# Patient Record
Sex: Female | Born: 2005 | Race: White | Hispanic: Yes | Marital: Single | Smoking: Never smoker
Health system: Southern US, Community
[De-identification: ages and names within clinical notes are randomized; demographics above are authoritative.]

## PROBLEM LIST (undated history)

## (undated) DIAGNOSIS — J020 Streptococcal pharyngitis: Secondary | ICD-10-CM

## (undated) DIAGNOSIS — B2790 Infectious mononucleosis, unspecified without complication: Secondary | ICD-10-CM

## (undated) DIAGNOSIS — N390 Urinary tract infection, site not specified: Secondary | ICD-10-CM

## (undated) DIAGNOSIS — R509 Fever, unspecified: Secondary | ICD-10-CM

## (undated) DIAGNOSIS — N12 Tubulo-interstitial nephritis, not specified as acute or chronic: Principal | ICD-10-CM

## (undated) HISTORY — DX: Fever, unspecified: R50.9

## (undated) HISTORY — DX: Streptococcal pharyngitis: J02.0

## (undated) HISTORY — DX: Tubulo-interstitial nephritis, not specified as acute or chronic: N12

## (undated) HISTORY — DX: Infectious mononucleosis, unspecified without complication: B27.90

---
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
## 2023-02-19 NOTE — Patient Instructions (Incomplete)
I have translated the following text using Google translate.  As such, there are many errors.  I apologize for the poor written translation; however, we do not have written  translation services yet. It was wonderful to see you today. Thank you for allowing me to be a part of your care. Below is a short summary of what we discussed at your visit today:  Annual physical Today we obtained a blood sample. If the results are normal, I will send you a letter or MyChart message. If the results are abnormal, I will give you a call.    Please set up your MyChart app so you can have quick, convenient access to your medical records and test results.   TB test We collected a test called a quantiferon GOLD blood test, which screens for TB. 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 If the results are normal, I will send you a letter or MyChart message. If the results are abnormal, I will give you a call.    Health Maintenance We like to think about ways to keep you healthy for years to come. Below are some interventions and screenings we can offer to keep you healthy: - Flu vaccine - COVID booster - TDaP vaccine   Please bring all of your medications to every appointment!  If you have any questions or concerns, please do not hesitate to contact us via phone or MyChart message.   Ezequiel Essex, MD

## 2023-02-20 ENCOUNTER — Ambulatory Visit (INDEPENDENT_AMBULATORY_CARE_PROVIDER_SITE_OTHER): Payer: Medicaid Other | Admitting: Family Medicine

## 2023-02-20 VITALS — BP 100/60 | HR 101 | Ht 63.5 in | Wt 92.2 lb

## 2023-02-20 DIAGNOSIS — Z111 Encounter for screening for respiratory tuberculosis: Secondary | ICD-10-CM

## 2023-02-20 DIAGNOSIS — Z0000 Encounter for general adult medical examination without abnormal findings: Secondary | ICD-10-CM | POA: Diagnosis not present

## 2023-02-20 DIAGNOSIS — R946 Abnormal results of thyroid function studies: Secondary | ICD-10-CM | POA: Insufficient documentation

## 2023-02-20 DIAGNOSIS — Z131 Encounter for screening for diabetes mellitus: Secondary | ICD-10-CM

## 2023-02-20 DIAGNOSIS — E049 Nontoxic goiter, unspecified: Secondary | ICD-10-CM | POA: Diagnosis not present

## 2023-02-20 DIAGNOSIS — Z114 Encounter for screening for human immunodeficiency virus [HIV]: Secondary | ICD-10-CM | POA: Diagnosis not present

## 2023-02-20 DIAGNOSIS — Z1159 Encounter for screening for other viral diseases: Secondary | ICD-10-CM

## 2023-02-20 NOTE — Progress Notes (Addendum)
   Adolescent Well Care Visit Jillian Merritt is a 17 y.o. female who is here for well care.     PCP:  Ezequiel Essex, MD   History was provided by the patient and mother.  Current Issues: Current concerns include wanting to gain weight.   PHQ-9 completed and results indicated: [place] Office Visit from 02/08/2022 in [place]  PHQ-9 Total Score 0       Safe at home, in school & in relationships?  Yes Safe to self?  Yes   Nutrition: Nutrition/Eating Behaviors: Trying to eat more portions of the food cooked by mom at home - Well-rounded diet - Fruits, vegetables, protein Restrictive eating patterns/purging: None  Social Screening: Lives with: Mom, brother, dad Parental relations:  good Concerns regarding behavior with peers?  no Stressors of note: no  Education: School Concerns: None School performance:outstanding School Behavior: doing well; no concerns  Patient has a dental home: yes  Menstruation:   Patient's last menstrual period was 02/15/2023.  Physical Exam:  BP 100/60   Pulse (!) 101   Ht 5' 3.5" (1.613 m)   Wt 92 lb 3.2 oz (41.8 kg)   LMP 02/15/2023   SpO2 99%   BMI 16.08 kg/m  Body mass index: body mass index is 16.08 kg/m. Blood pressure %iles are not available for patients who are 18 years or older.  Gen: awake, alert, NAD Neck: Supple, thyroid fuller to palpation on the left Cardiac: Regular rhythm.  Mildly tachycardic to low 100s.  Normal S1/S2. No murmurs, rubs, or gallops appreciated. Lungs: Clear bilaterally to ascultation.  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   Ezequiel Essex, MD

## 2023-02-20 NOTE — Assessment & Plan Note (Addendum)
-   Feels little more full on the left than the right to physician physical exam - Patient has no complaints about it and has not noticed this - Given inability to gain weight and mild asymptomatic tachycardia, will obtain TSH with reflex to free T4

## 2023-02-21 LAB — CBC
Hematocrit: 42.7 % (ref 34.0–46.6)
Hemoglobin: 13.6 g/dL (ref 11.1–15.9)
MCV: 93 fL (ref 79–97)
Platelets: 326 10*3/uL (ref 150–450)
RBC: 4.57 x10E6/uL (ref 3.77–5.28)

## 2023-02-21 LAB — BASIC METABOLIC PANEL
CO2: 21 mmol/L (ref 20–29)
Creatinine, Ser: 0.69 mg/dL (ref 0.57–1.00)
Glucose: 91 mg/dL (ref 70–99)

## 2023-02-21 LAB — QUANTIFERON-TB GOLD PLUS

## 2023-02-22 ENCOUNTER — Ambulatory Visit: Payer: Self-pay

## 2023-02-22 LAB — CBC
MCH: 29.8 pg (ref 26.6–33.0)
MCHC: 31.9 g/dL (ref 31.5–35.7)
RDW: 13.1 % (ref 11.7–15.4)
WBC: 5.8 10*3/uL (ref 3.4–10.8)

## 2023-02-22 LAB — HIV ANTIBODY (ROUTINE TESTING W REFLEX): HIV Screen 4th Generation wRfx: NONREACTIVE

## 2023-02-22 LAB — BASIC METABOLIC PANEL
BUN: 17 mg/dL (ref 6–20)
Chloride: 106 mmol/L (ref 96–106)
Sodium: 143 mmol/L (ref 134–144)

## 2023-03-01 ENCOUNTER — Encounter: Payer: Self-pay | Admitting: Family Medicine

## 2023-03-01 LAB — HEMOGLOBIN A1C
Est. average glucose Bld gHb Est-mCnc: 103 mg/dL
Hgb A1c MFr Bld: 5.2 % (ref 4.8–5.6)

## 2023-03-01 LAB — QUANTIFERON-TB GOLD PLUS
QuantiFERON Mitogen Value: 8.71 IU/mL
QuantiFERON Nil Value: 0.01 IU/mL
QuantiFERON TB1 Ag Value: 0.05 IU/mL
QuantiFERON TB2 Ag Value: 0.1 IU/mL
QuantiFERON-TB Gold Plus: NEGATIVE

## 2023-03-01 LAB — TSH RFX ON ABNORMAL TO FREE T4: TSH: 2.08 u[IU]/mL (ref 0.450–4.500)

## 2023-03-01 LAB — BASIC METABOLIC PANEL
BUN/Creatinine Ratio: 25 — ABNORMAL HIGH (ref 9–23)
Calcium: 9.7 mg/dL (ref 8.7–10.2)
Potassium: 4.3 mmol/L (ref 3.5–5.2)
eGFR: 129 mL/min/{1.73_m2} (ref >59)

## 2023-03-01 LAB — HEPATITIS C ANTIBODY: Hep C Virus Ab: NONREACTIVE

## 2023-09-20 ENCOUNTER — Ambulatory Visit (INDEPENDENT_AMBULATORY_CARE_PROVIDER_SITE_OTHER): Payer: Worker's Compensation

## 2023-09-20 ENCOUNTER — Ambulatory Visit (HOSPITAL_COMMUNITY)
Admission: EM | Admit: 2023-09-20 | Discharge: 2023-09-20 | Disposition: A | Discharge status: Home / Self Care | Payer: Worker's Compensation | Attending: Internal Medicine | Admitting: Internal Medicine

## 2023-09-20 ENCOUNTER — Encounter (HOSPITAL_COMMUNITY): Payer: Self-pay | Admitting: *Deleted

## 2023-09-20 ENCOUNTER — Other Ambulatory Visit: Payer: Self-pay

## 2023-09-20 DIAGNOSIS — S93602A Unspecified sprain of left foot, initial encounter: Secondary | ICD-10-CM

## 2023-09-20 DIAGNOSIS — S99922A Unspecified injury of left foot, initial encounter: Secondary | ICD-10-CM

## 2023-09-20 MED ORDER — IBUPROFEN 800 MG PO TABS
ORAL_TABLET | ORAL | Status: AC
  Filled 2023-09-20: qty 1

## 2023-09-20 MED ORDER — IBUPROFEN 800 MG PO TABS
800.0000 mg | ORAL_TABLET | Freq: Once | ORAL | Status: AC
  Administered 2023-09-20: 800 mg via ORAL

## 2023-09-20 MED ORDER — IBUPROFEN 800 MG PO TABS: 800.0000 mg | ORAL_TABLET | Freq: Three times a day (TID) | ORAL | 0 refills | Status: AC

## 2023-09-20 NOTE — ED Triage Notes (Signed)
Pt reports yesterday she was injured at work by a Horticulturist, commercial . Pt has pain to whole foot and back of ankle.

## 2023-09-20 NOTE — ED Provider Notes (Signed)
[facility]    CSN: [id number] Arrival date & time: 09/20/23  1802     History   Chief Complaint Chief Complaint  Patient presents with   Foot Injury    HPI Jillian Merritt is a 17 y.o. female.  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 May have twisted under her. Having 10/10 pain midfoot  No medications taken yet. She went to work today but had pain with weightbearing   No prior injury to foot  Past Medical History:  Diagnosis Date   Fever 03/27/2012   Mononucleosis 12/04/2012   Pyelonephritis 03/27/2012   Strep pharyngitis 03/27/2012   Strep throat    Urinary tract infection     Patient Active Problem List   Diagnosis Date Noted   Abnormal thyroid exam 02/20/2023   Poor weight gain (0-17) 02/08/2022   Acne vulgaris 01/06/2020    History reviewed. No pertinent surgical history.  OB History   No obstetric history on file.      Home Medications    Prior to Admission medications   Medication Sig Start Date End Date Taking? Authorizing Provider  ibuprofen (ADVIL) 800 MG tablet Take 1 tablet (800 mg total) by mouth 3 (three) times daily. 09/20/23  Yes Gabriana Wilmott, Lurena Joiner, PA-C    Family History Family History  Problem Relation Age of Onset   Diabetes Paternal Grandmother    Healthy Father     Social History Social History   Tobacco Use   Smoking status: Never   Smokeless tobacco: Never  Substance Use Topics   Alcohol use: No   Drug use: No     Allergies   Patient has no known allergies.   Review of Systems Review of Systems Per HPI  Physical Exam Triage Vital Signs ED Triage Vitals  Encounter Vitals Group     BP 09/20/23 1830 98/66     Systolic BP Percentile --      Diastolic BP Percentile --      Pulse Rate 09/20/23 1830 82     Resp 09/20/23 1830 16     Temp 09/20/23 1830 98.2 F  (36.8 C)     Temp src --      SpO2 09/20/23 1830 99 %     Weight --      Height --      Head Circumference --      Peak Flow --      Pain Score 09/20/23 1826 10     Pain Loc --      Pain Education --      Exclude from Growth Chart --    No data found.  Updated Vital Signs BP 98/66   Pulse 82   Temp 98.2 F (36.8 C)   Resp 16   LMP 09/08/2023   SpO2 99%    Physical Exam Vitals and nursing note reviewed.  Constitutional:      General: She is not in acute distress. Cardiovascular:     Rate and Rhythm: Normal rate and regular rhythm.     Pulses: Normal pulses.  Pulmonary:     Effort: Pulmonary effort is normal.  Musculoskeletal:     Cervical back: Normal range of motion.     Left foot: Decreased range of motion. Normal capillary refill. Swelling, tenderness and bony tenderness present. Normal pulse.     Comments:  Swelling of dorsal foot. Tender midfoot. No bony tenderness of toes or ankles. Can wiggle toes. Decreased ROM at ankle due to pain. Distal sensation intact. Strong DP and PT pulses. Cap refill < 2 seconds  Skin:    General: Skin is warm and dry.     Capillary Refill: Capillary refill takes less than 2 seconds.  Neurological:     Mental Status: She is alert and oriented to person, place, and time.     UC Treatments / Results  Labs (all labs ordered are listed, but only abnormal results are displayed) Labs Reviewed - No data to display  EKG  Radiology DG Foot Complete Left  Result Date: 09/20/2023 CLINICAL DATA:  Work injury whole foot pain EXAM: LEFT FOOT - COMPLETE 3+ VIEW COMPARISON:  None Available. FINDINGS: There is no evidence of fracture or dislocation. There is no evidence of arthropathy or other focal bone abnormality. Soft tissues are unremarkable. IMPRESSION: Negative. Electronically Signed   By: Jasmine Pang M.D.   On: 09/20/2023 19:31    Procedures Procedures (including critical care time)  Medications Ordered in UC Medications   ibuprofen (ADVIL) tablet 800 mg (800 mg Oral Given 09/20/23 1846)    Initial Impression / Assessment and Plan / UC Course  I have reviewed the triage vital signs and the nursing notes.  Pertinent labs & imaging results that were available during my care of the patient were reviewed by me and considered in my medical decision making (see chart for details).  Ibuprofen dose given for pain X-ray left foot negative. Images independently reviewed by me, agree with radiology interpretation. Discussed RICE therapy, ibuprofen, follow up with podiatry. Provided ace wrap for support.  Provided with employee health and wellness clinic for restrictions if needed.  Patient agreeable to plan, no questions at this time  Patient able to bear weight and having less discomfort at discharge   Final Clinical Impressions(s) / UC Diagnoses   Final diagnoses:  Injury of left foot, initial encounter  Sprain of left foot, initial encounter     Discharge Instructions      There is no broken bone in your foot. You may have sprained. it  Rest - try to avoid heavy lifting and high impact activity Ice - apply for 20 minutes a few times daily Compression - use ace wrap for support  Elevation - prop up on a pillow  Ibuprofen can be used every 6 hours for swelling and pain  Please follow up with the foot specialist  If you need work restrictions, please go to the employee health and wellness clinic     ED Prescriptions     Medication Sig Dispense Auth. Provider   ibuprofen (ADVIL) 800 MG tablet Take 1 tablet (800 mg total) by mouth 3 (three) times daily. 21 tablet Mack Thurmon, Lurena Joiner, PA-C      PDMP not reviewed this encounter.   Marlow Baars, [place] 09/20/23 1610

## 2023-09-20 NOTE — Discharge Instructions (Addendum)
There is no broken bone in your foot. You may have sprained. it  Rest - try to avoid heavy lifting and high impact activity Ice - apply for 20 minutes a few times daily Compression - use ace wrap for support  Elevation - prop up on a pillow  Ibuprofen can be used every 6 hours for swelling and pain  Please follow up with the foot specialist  If you need work restrictions, please go to the employee health and wellness clinic

## 2023-09-20 NOTE — ED Notes (Signed)
ACE wrap placed by provider

## 2023-09-30 ENCOUNTER — Ambulatory Visit (INDEPENDENT_AMBULATORY_CARE_PROVIDER_SITE_OTHER): Payer: Medicaid Other | Admitting: Podiatry

## 2023-09-30 ENCOUNTER — Encounter: Payer: Self-pay | Admitting: Podiatry

## 2023-09-30 ENCOUNTER — Ambulatory Visit (INDEPENDENT_AMBULATORY_CARE_PROVIDER_SITE_OTHER): Payer: Medicaid Other

## 2023-09-30 VITALS — Ht 63.5 in | Wt 100.0 lb

## 2023-09-30 DIAGNOSIS — M778 Other enthesopathies, not elsewhere classified: Secondary | ICD-10-CM

## 2023-09-30 DIAGNOSIS — S99922A Unspecified injury of left foot, initial encounter: Secondary | ICD-10-CM | POA: Diagnosis not present

## 2023-09-30 DIAGNOSIS — M7752 Other enthesopathy of left foot: Secondary | ICD-10-CM

## 2023-09-30 NOTE — Progress Notes (Signed)
   Chief Complaint  Patient presents with   Foot Pain    Left foot pain due to injury hurt foot and ankle at work, still swollen    HPI: 17 y.o. female presenting today as a new patient for evaluation of left foot sprain that was sustained while working.  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  The forklift did not really help with her foot but in the process she did sprain her left foot and ankle.  She went to an urgent care and diagnosed with foot sprain..  DOI: 09/19/2023 Patient states that throughout the day she is able to tolerate just fine and she does have some work restrictions in place.  Past Medical History:  Diagnosis Date   Fever 03/27/2012   Mononucleosis 12/04/2012   Pyelonephritis 03/27/2012   Strep pharyngitis 03/27/2012   Strep throat    Urinary tract infection     History reviewed. No pertinent surgical history.  No Known Allergies   Physical Exam: General: The patient is alert and oriented x3 in no acute distress.  Dermatology: Skin is warm, dry and supple bilateral lower extremities.   Vascular: Palpable pedal pulses bilaterally. Capillary refill within normal limits.  No erythema.  Mild edema noted.  There is also some diffuse mild ecchymosis noted to the lateral aspect of the heel and the toes  Neurological: Grossly intact via light touch  Musculoskeletal Exam: No pedal deformities noted.  Muscle strength 5/5 all compartments.  Generalized diffuse mild tenderness to the foot and ankle  Radiographic Exam LT foot and ankle 09/30/2023:  Normal osseous mineralization. Joint spaces preserved.  No fractures or osseous irregularities noted.  Impression: Negative  Assessment/Plan of Care: 1.  Foot sprain left  -Overall the patient states that she is able to ambulate just fine in tennis shoes.  For now we will not pursue immobilization in the cam boot.  Recommend good supportive  tennis shoes and sneakers -Compression ankle sleeve dispensed.  Wear daily -Continue work restrictions that were provided by occupational health at work -Once symptoms resolve patient may resume full activity no restrictions -Return to clinic as needed       Jillian Merritt, DPM [facility]  Dr. Felecia Merritt, DPM    [address] [place], [place] [address]                Office [phone]  Fax [phone]

## 2024-02-21 ENCOUNTER — Ambulatory Visit: Payer: Medicaid Other | Admitting: Student

## 2024-02-21 ENCOUNTER — Encounter: Payer: Medicaid Other | Admitting: Family Medicine

## 2024-02-21 VITALS — BP 98/70 | HR 80 | Temp 98.2°F | Ht 62.0 in | Wt 92.2 lb

## 2024-02-21 DIAGNOSIS — R636 Underweight: Secondary | ICD-10-CM | POA: Diagnosis not present

## 2024-02-21 DIAGNOSIS — Z0000 Encounter for general adult medical examination without abnormal findings: Secondary | ICD-10-CM

## 2024-02-21 NOTE — Patient Instructions (Signed)
 It was great to see you today!   Follow up in one year or sooner if needed.   No future appointments.  Please arrive 15 minutes before your appointment to ensure smooth check in process.    Please call the clinic at [phone] if your symptoms worsen or you have any concerns.  Thank you for allowing me to participate in your care, Dr. Glendale Chard [facility]

## 2024-02-21 NOTE — Assessment & Plan Note (Signed)
 Denies restrictive eating patterns.  Weight appears to be stable from March 2024.

## 2024-02-21 NOTE — Progress Notes (Signed)
    SUBJECTIVE:   CHIEF COMPLAINT / HPI:   Jillian Merritt is a 18 y.o. female  presenting for annual physical. She has been doing well. Not currently sexually active. She denies restrictive eating patterns, binge eating or history of eating disorders.   PERTINENT  PMH / PSH: Reviewed and updated   OBJECTIVE:   BP 98/70   Pulse 80   Temp 98.2 F (36.8 C)   Ht 5\' 2"  (1.575 m)   Wt 92 lb 3.2 oz (41.8 kg)   SpO2 99%   BMI 16.86 kg/m   Well-appearing, no acute distress Cardio: Regular rate, regular rhythm, no murmurs on exam. Pulm: Clear, no wheezing, no crackles. No increased work of breathing Abdominal: bowel sounds present, soft, non-tender, non-distended Extremities: no peripheral edema  Neuro: alert and oriented x3, speech normal in content, no facial asymmetry, strength intact and equal bilaterally in UE and LE, pupils equal and reactive to light.  Psych:  Flat affect. Cognition and judgment appear intact. Alert, communicative  and cooperative with normal attention span and concentration. No apparent delusions, illusions, hallucinations      02/20/2023    9:50 AM 02/08/2022    8:57 AM 01/31/2021    2:00 PM  PHQ9 SCORE ONLY  PHQ-9 Total Score 0 0 0      ASSESSMENT/PLAN:   Low weight Denies restrictive eating patterns.  Weight appears to be stable from March 2024.   Health maintenance: Up-to-date with routine screenings.  No blood work needed today.  Patient will not be ready for a Pap smear until she is 19.  She is not currently sexually active and does not need STI testing or birth control at this time.  Glendale Chard, DO [facility]
